# Patient Record
Sex: Female | Born: 1995 | Race: Asian | Hispanic: No | Marital: Single
Health system: Midwestern US, Community
[De-identification: ages and names within clinical notes are randomized; demographics above are authoritative.]

## PROBLEM LIST (undated history)

## (undated) DIAGNOSIS — N6320 Unspecified lump in the left breast, unspecified quadrant: Secondary | ICD-10-CM

---
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
## 2016-08-13 ENCOUNTER — Ambulatory Visit (HOSPITAL_COMMUNITY)
Admission: EM | Admit: 2016-08-13 | Discharge: 2016-08-13 | Disposition: A | Discharge status: Home / Self Care | Payer: BLUE CROSS/BLUE SHIELD | Attending: Family Medicine | Admitting: Family Medicine

## 2016-08-13 ENCOUNTER — Encounter (HOSPITAL_COMMUNITY): Payer: Self-pay | Admitting: Emergency Medicine

## 2016-08-13 DIAGNOSIS — N3090 Cystitis, unspecified without hematuria: Secondary | ICD-10-CM

## 2016-08-13 LAB — POCT URINALYSIS DIP (DEVICE)
BILIRUBIN URINE: NEGATIVE
Glucose, UA: NEGATIVE mg/dL
Ketones, ur: NEGATIVE mg/dL
NITRITE: NEGATIVE
PH: 8 (ref 5.0–8.0)
Protein, ur: NEGATIVE mg/dL
Specific Gravity, Urine: 1.02 (ref 1.005–1.030)
Urobilinogen, UA: 0.2 mg/dL (ref 0.0–1.0)

## 2016-08-13 LAB — POCT PREGNANCY, URINE: Preg Test, Ur: NEGATIVE

## 2016-08-13 MED ORDER — CEPHALEXIN 500 MG PO CAPS: 500.0000 mg | ORAL_CAPSULE | Freq: Four times a day (QID) | ORAL | 0 refills | Status: DC

## 2016-08-13 NOTE — ED Triage Notes (Signed)
Pt states when she urinates she is not emptying her bladder all the way.She denies any back or abdominal pain, or any itching.  She reports some pink in her urine today.

## 2016-08-13 NOTE — ED Provider Notes (Addendum)
[facility]    CSN: [id number] Arrival date & time: 08/13/16  1922     History   Chief Complaint Chief Complaint  Patient presents with  . Urinary Tract Infection    HPI Jennifer Strickland is a 21 y.o. female.   The history is provided by the patient.  Urinary Tract Infection  Pain quality:  Shooting and burning Pain severity:  Mild Onset quality:  Gradual Duration:  2 days Progression:  Worsening Chronicity:  New Recent urinary tract infections: no   Relieved by:  None tried Worsened by:  Nothing Ineffective treatments:  None tried Urinary symptoms: foul-smelling urine and hematuria   Associated symptoms: no abdominal pain, no fever, no flank pain, no nausea, no vaginal discharge and no vomiting   Risk factors: sexually active     History reviewed. No pertinent past medical history.  There are no active problems to display for this patient.   History reviewed. No pertinent surgical history.  OB History    No data available       Home Medications    Prior to Admission medications   Medication Sig Start Date End Date Taking? Authorizing Provider  acetaminophen (TYLENOL) 500 MG tablet Take 500 mg by mouth every 6 (six) hours as needed.    Historical Provider, MD  cephALEXin (KEFLEX) 500 MG capsule Take 1 capsule (500 mg total) by mouth 4 (four) times daily. Take all of medicine and drink lots of fluids 08/13/16   Linna HoffJames D Kindl, MD    Family History History reviewed. No pertinent family history.  Social History Social History  Substance Use Topics  . Smoking status: Never Smoker  . Smokeless tobacco: Never Used  . Alcohol use Yes     Comment: occasional     Allergies   Patient has no known allergies.   Review of Systems Review of Systems  Constitutional: Negative.  Negative for fever.  Respiratory: Negative.   Cardiovascular: Negative.   Gastrointestinal: Negative.  Negative for abdominal pain, nausea and vomiting.  Genitourinary:  Positive for dysuria, frequency and hematuria. Negative for flank pain, pelvic pain, urgency and vaginal discharge.  All other systems reviewed and are negative.    Physical Exam Triage Vital Signs ED Triage Vitals [08/13/16 2029]  Enc Vitals Group     BP 108/71     Pulse Rate 100     Resp      Temp 99.7 F (37.6 C)     Temp Source Oral     SpO2 100 %     Weight      Height      Head Circumference      Peak Flow      Pain Score      Pain Loc      Pain Edu?      Excl. in GC?    No data found.   Updated Vital Signs BP 108/71 (BP Location: Left Arm)   Pulse 100   Temp 99.7 F (37.6 C) (Oral)   LMP 08/12/2015 (Approximate)   SpO2 100%   Visual Acuity Right Eye Distance:   Left Eye Distance:   Bilateral Distance:    Right Eye Near:   Left Eye Near:    Bilateral Near:     Physical Exam  Constitutional: She appears well-developed and well-nourished. No distress.  Abdominal: Soft. Bowel sounds are normal. She exhibits no mass. There is no tenderness. There is no guarding.  Skin: Skin is warm and dry.  Nursing note and vitals reviewed.    UC Treatments / Results  Labs (all labs ordered are listed, but only abnormal results are displayed) Labs Reviewed  POCT URINALYSIS DIP (DEVICE) - Abnormal; Notable for the following:       Result Value   Hgb urine dipstick SMALL (*)    Leukocytes, UA MODERATE (*)    All other components within normal limits  POCT PREGNANCY, URINE    EKG  EKG Interpretation None       Radiology No results found.  Procedures Procedures (including critical care time)  Medications Ordered in UC Medications - No data to display   Initial Impression / Assessment and Plan / UC Course  I have reviewed the triage vital signs and the nursing notes.  Pertinent labs & imaging results that were available during my care of the patient were reviewed by me and considered in my medical decision making (see chart for details).        Final Clinical Impressions(s) / UC Diagnoses   Final diagnoses:  Cystitis    New Prescriptions New Prescriptions   CEPHALEXIN (KEFLEX) 500 MG CAPSULE    Take 1 capsule (500 mg total) by mouth 4 (four) times daily. Take all of medicine and drink lots of fluids     Linna Hoff, MD 08/13/16 4098    Linna Hoff, MD 08/13/16 2102

## 2018-07-20 DIAGNOSIS — Z01419 Encounter for gynecological examination (general) (routine) without abnormal findings: Secondary | ICD-10-CM | POA: Diagnosis not present

## 2018-07-20 DIAGNOSIS — Z23 Encounter for immunization: Secondary | ICD-10-CM | POA: Diagnosis not present

## 2018-07-20 DIAGNOSIS — Z113 Encounter for screening for infections with a predominantly sexual mode of transmission: Secondary | ICD-10-CM | POA: Diagnosis not present

## 2018-07-20 DIAGNOSIS — Z6824 Body mass index (BMI) 24.0-24.9, adult: Secondary | ICD-10-CM | POA: Diagnosis not present

## 2018-08-12 DIAGNOSIS — Z113 Encounter for screening for infections with a predominantly sexual mode of transmission: Secondary | ICD-10-CM | POA: Diagnosis not present

## 2018-08-12 DIAGNOSIS — A749 Chlamydial infection, unspecified: Secondary | ICD-10-CM | POA: Diagnosis not present

## 2018-09-21 DIAGNOSIS — Z23 Encounter for immunization: Secondary | ICD-10-CM | POA: Diagnosis not present

## 2018-12-14 DIAGNOSIS — H04123 Dry eye syndrome of bilateral lacrimal glands: Secondary | ICD-10-CM | POA: Diagnosis not present

## 2019-02-01 DIAGNOSIS — R87610 Atypical squamous cells of undetermined significance on cytologic smear of cervix (ASC-US): Secondary | ICD-10-CM | POA: Diagnosis not present

## 2019-04-05 DIAGNOSIS — K011 Impacted teeth: Secondary | ICD-10-CM | POA: Diagnosis not present

## 2019-04-26 DIAGNOSIS — N760 Acute vaginitis: Secondary | ICD-10-CM | POA: Diagnosis not present

## 2019-04-26 DIAGNOSIS — Z113 Encounter for screening for infections with a predominantly sexual mode of transmission: Secondary | ICD-10-CM | POA: Diagnosis not present

## 2019-07-26 DIAGNOSIS — Z1329 Encounter for screening for other suspected endocrine disorder: Secondary | ICD-10-CM | POA: Diagnosis not present

## 2019-07-26 DIAGNOSIS — Z6823 Body mass index (BMI) 23.0-23.9, adult: Secondary | ICD-10-CM | POA: Diagnosis not present

## 2019-07-26 DIAGNOSIS — Z113 Encounter for screening for infections with a predominantly sexual mode of transmission: Secondary | ICD-10-CM | POA: Diagnosis not present

## 2019-07-26 DIAGNOSIS — Z3040 Encounter for surveillance of contraceptives, unspecified: Secondary | ICD-10-CM | POA: Diagnosis not present

## 2019-07-26 DIAGNOSIS — Z13220 Encounter for screening for lipoid disorders: Secondary | ICD-10-CM | POA: Diagnosis not present

## 2019-07-26 DIAGNOSIS — Z01419 Encounter for gynecological examination (general) (routine) without abnormal findings: Secondary | ICD-10-CM | POA: Diagnosis not present

## 2019-07-26 DIAGNOSIS — Z13228 Encounter for screening for other metabolic disorders: Secondary | ICD-10-CM | POA: Diagnosis not present

## 2019-08-09 DIAGNOSIS — R87612 Low grade squamous intraepithelial lesion on cytologic smear of cervix (LGSIL): Secondary | ICD-10-CM | POA: Diagnosis not present

## 2019-08-09 DIAGNOSIS — N870 Mild cervical dysplasia: Secondary | ICD-10-CM | POA: Diagnosis not present

## 2019-08-09 DIAGNOSIS — R87810 Cervical high risk human papillomavirus (HPV) DNA test positive: Secondary | ICD-10-CM | POA: Diagnosis not present

## 2019-08-09 DIAGNOSIS — N72 Inflammatory disease of cervix uteri: Secondary | ICD-10-CM | POA: Diagnosis not present

## 2019-08-31 DIAGNOSIS — N871 Moderate cervical dysplasia: Secondary | ICD-10-CM | POA: Diagnosis not present

## 2019-08-31 DIAGNOSIS — N870 Mild cervical dysplasia: Secondary | ICD-10-CM | POA: Diagnosis not present

## 2019-11-15 ENCOUNTER — Other Ambulatory Visit: Payer: Self-pay

## 2019-11-15 ENCOUNTER — Encounter: Payer: Self-pay | Admitting: Podiatry

## 2019-11-15 ENCOUNTER — Ambulatory Visit: Payer: BC Managed Care – PPO

## 2019-11-15 ENCOUNTER — Ambulatory Visit (INDEPENDENT_AMBULATORY_CARE_PROVIDER_SITE_OTHER): Payer: BC Managed Care – PPO | Admitting: Podiatry

## 2019-11-15 ENCOUNTER — Other Ambulatory Visit: Payer: Self-pay | Admitting: Podiatry

## 2019-11-15 ENCOUNTER — Ambulatory Visit (INDEPENDENT_AMBULATORY_CARE_PROVIDER_SITE_OTHER): Payer: BC Managed Care – PPO

## 2019-11-15 VITALS — BP 92/52 | HR 79 | Temp 97.3°F | Resp 16

## 2019-11-15 DIAGNOSIS — M779 Enthesopathy, unspecified: Secondary | ICD-10-CM

## 2019-11-15 DIAGNOSIS — M79671 Pain in right foot: Secondary | ICD-10-CM

## 2019-11-15 DIAGNOSIS — M79672 Pain in left foot: Secondary | ICD-10-CM

## 2019-11-15 MED ORDER — MELOXICAM 15 MG PO TABS: 15.0000 mg | ORAL_TABLET | Freq: Every day | ORAL | 2 refills | Status: AC

## 2019-11-15 NOTE — Progress Notes (Signed)
DNOTE Subjective:   Patient ID: Jennifer Strickland, female   DOB: 24 y.o.   MRN: [id number]   HPI Patient presents stating that I get a lot of pain in my midfoot and heel both feet its been going on for around 3 years and the last years been bothering him more.  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  Patient does not smoke likes to be active   Review of Systems  All other systems reviewed and are negative.       Objective:  Physical Exam Vitals and nursing note reviewed.  Constitutional:      Appearance: She is well-developed.  Pulmonary:     Effort: Pulmonary effort is normal.  Musculoskeletal:        General: Normal range of motion.  Skin:    General: Skin is warm.  Neurological:     Mental Status: She is alert.     Neurovascular status intact muscle strength adequate range of motion within normal limits.  Patient is noted to have a lot of discomfort in the mid arch bilateral with inflammation also extending to the heel but no specific area of pain or positive Tinel's sign or other indications of nerve issues.  Mild depression of the arch but not within pathological range     Assessment:  Appears to be some form of a low-grade tendinitis fasciitis condition bilateral right over left     Plan:  H&P reviewed conditions and x-rays and today we went ahead and casted for functional orthotics to try to reduce stress on her feet.  Educated her on the type of running shoes I think would be best and stretching exercises and reappoint when orthotics ready and also placed on Mobic 15 mg daily  X-rays indicate that there is no signs of fracture or bone pathology

## 2019-11-15 NOTE — Patient Instructions (Signed)

## 2019-11-15 NOTE — Progress Notes (Signed)
   Subjective:    Patient ID: Jennifer Strickland, female    DOB: 26-Oct-1995, 24 y.o.   MRN: [id number]  HPI    Review of Systems  All other systems reviewed and are negative.      Objective:   Physical Exam        Assessment & Plan:

## 2019-12-22 ENCOUNTER — Ambulatory Visit: Payer: BC Managed Care – PPO | Admitting: Orthotics

## 2019-12-22 ENCOUNTER — Other Ambulatory Visit: Payer: Self-pay

## 2019-12-22 DIAGNOSIS — M79672 Pain in left foot: Secondary | ICD-10-CM

## 2019-12-22 DIAGNOSIS — M779 Enthesopathy, unspecified: Secondary | ICD-10-CM

## 2019-12-22 NOTE — Progress Notes (Signed)
Patient came in today to pick up custom made foot orthotics.  The goals were accomplished and the patient reported no dissatisfaction with said orthotics.  Patient was advised of breakin period and how to report any issues. 

## 2020-01-13 DIAGNOSIS — R21 Rash and other nonspecific skin eruption: Secondary | ICD-10-CM | POA: Diagnosis not present

## 2020-01-26 DIAGNOSIS — L282 Other prurigo: Secondary | ICD-10-CM | POA: Diagnosis not present

## 2020-01-26 DIAGNOSIS — R21 Rash and other nonspecific skin eruption: Secondary | ICD-10-CM | POA: Diagnosis not present

## 2020-02-14 DIAGNOSIS — H5213 Myopia, bilateral: Secondary | ICD-10-CM | POA: Diagnosis not present

## 2020-02-14 DIAGNOSIS — H53143 Visual discomfort, bilateral: Secondary | ICD-10-CM | POA: Diagnosis not present

## 2020-02-16 DIAGNOSIS — Z9889 Other specified postprocedural states: Secondary | ICD-10-CM | POA: Diagnosis not present

## 2020-02-16 DIAGNOSIS — R87613 High grade squamous intraepithelial lesion on cytologic smear of cervix (HGSIL): Secondary | ICD-10-CM | POA: Diagnosis not present

## 2020-02-21 DIAGNOSIS — S40862A Insect bite (nonvenomous) of left upper arm, initial encounter: Secondary | ICD-10-CM | POA: Diagnosis not present

## 2020-04-24 DIAGNOSIS — N760 Acute vaginitis: Secondary | ICD-10-CM | POA: Diagnosis not present

## 2020-04-24 DIAGNOSIS — Z113 Encounter for screening for infections with a predominantly sexual mode of transmission: Secondary | ICD-10-CM | POA: Diagnosis not present

## 2020-05-09 DIAGNOSIS — F4325 Adjustment disorder with mixed disturbance of emotions and conduct: Secondary | ICD-10-CM | POA: Diagnosis not present

## 2020-05-23 DIAGNOSIS — F4325 Adjustment disorder with mixed disturbance of emotions and conduct: Secondary | ICD-10-CM | POA: Diagnosis not present

## 2020-06-07 DIAGNOSIS — F4325 Adjustment disorder with mixed disturbance of emotions and conduct: Secondary | ICD-10-CM | POA: Diagnosis not present

## 2020-06-27 DIAGNOSIS — F4325 Adjustment disorder with mixed disturbance of emotions and conduct: Secondary | ICD-10-CM | POA: Diagnosis not present

## 2020-06-28 DIAGNOSIS — N900 Mild vulvar dysplasia: Secondary | ICD-10-CM | POA: Diagnosis not present

## 2020-06-30 DIAGNOSIS — Z01818 Encounter for other preprocedural examination: Secondary | ICD-10-CM | POA: Diagnosis not present

## 2020-06-30 DIAGNOSIS — L988 Other specified disorders of the skin and subcutaneous tissue: Secondary | ICD-10-CM | POA: Diagnosis not present

## 2020-06-30 DIAGNOSIS — L538 Other specified erythematous conditions: Secondary | ICD-10-CM | POA: Diagnosis not present

## 2020-07-25 DIAGNOSIS — F4325 Adjustment disorder with mixed disturbance of emotions and conduct: Secondary | ICD-10-CM | POA: Diagnosis not present

## 2020-08-08 DIAGNOSIS — F4325 Adjustment disorder with mixed disturbance of emotions and conduct: Secondary | ICD-10-CM | POA: Diagnosis not present

## 2020-08-15 DIAGNOSIS — F4325 Adjustment disorder with mixed disturbance of emotions and conduct: Secondary | ICD-10-CM | POA: Diagnosis not present

## 2020-08-29 DIAGNOSIS — F4325 Adjustment disorder with mixed disturbance of emotions and conduct: Secondary | ICD-10-CM | POA: Diagnosis not present

## 2020-09-12 DIAGNOSIS — F4325 Adjustment disorder with mixed disturbance of emotions and conduct: Secondary | ICD-10-CM | POA: Diagnosis not present

## 2020-09-26 DIAGNOSIS — F4325 Adjustment disorder with mixed disturbance of emotions and conduct: Secondary | ICD-10-CM | POA: Diagnosis not present

## 2020-10-02 DIAGNOSIS — Z01419 Encounter for gynecological examination (general) (routine) without abnormal findings: Secondary | ICD-10-CM | POA: Diagnosis not present

## 2020-10-02 DIAGNOSIS — Z6824 Body mass index (BMI) 24.0-24.9, adult: Secondary | ICD-10-CM | POA: Diagnosis not present

## 2020-10-10 DIAGNOSIS — F4325 Adjustment disorder with mixed disturbance of emotions and conduct: Secondary | ICD-10-CM | POA: Diagnosis not present

## 2020-10-24 DIAGNOSIS — F4325 Adjustment disorder with mixed disturbance of emotions and conduct: Secondary | ICD-10-CM | POA: Diagnosis not present

## 2020-11-07 DIAGNOSIS — F4325 Adjustment disorder with mixed disturbance of emotions and conduct: Secondary | ICD-10-CM | POA: Diagnosis not present

## 2020-11-21 DIAGNOSIS — F4325 Adjustment disorder with mixed disturbance of emotions and conduct: Secondary | ICD-10-CM | POA: Diagnosis not present

## 2020-12-19 DIAGNOSIS — F4325 Adjustment disorder with mixed disturbance of emotions and conduct: Secondary | ICD-10-CM | POA: Diagnosis not present

## 2021-01-02 DIAGNOSIS — F4325 Adjustment disorder with mixed disturbance of emotions and conduct: Secondary | ICD-10-CM | POA: Diagnosis not present

## 2021-01-10 DIAGNOSIS — F4325 Adjustment disorder with mixed disturbance of emotions and conduct: Secondary | ICD-10-CM | POA: Diagnosis not present

## 2021-01-30 DIAGNOSIS — F4325 Adjustment disorder with mixed disturbance of emotions and conduct: Secondary | ICD-10-CM | POA: Diagnosis not present

## 2021-02-27 DIAGNOSIS — F4325 Adjustment disorder with mixed disturbance of emotions and conduct: Secondary | ICD-10-CM | POA: Diagnosis not present

## 2021-03-15 DIAGNOSIS — F4325 Adjustment disorder with mixed disturbance of emotions and conduct: Secondary | ICD-10-CM | POA: Diagnosis not present

## 2021-03-27 DIAGNOSIS — F4325 Adjustment disorder with mixed disturbance of emotions and conduct: Secondary | ICD-10-CM | POA: Diagnosis not present

## 2021-03-29 DIAGNOSIS — F4325 Adjustment disorder with mixed disturbance of emotions and conduct: Secondary | ICD-10-CM | POA: Diagnosis not present

## 2021-04-12 DIAGNOSIS — F4325 Adjustment disorder with mixed disturbance of emotions and conduct: Secondary | ICD-10-CM | POA: Diagnosis not present

## 2021-04-23 DIAGNOSIS — R87610 Atypical squamous cells of undetermined significance on cytologic smear of cervix (ASC-US): Secondary | ICD-10-CM | POA: Diagnosis not present

## 2021-04-23 DIAGNOSIS — Z0142 Encounter for cervical smear to confirm findings of recent normal smear following initial abnormal smear: Secondary | ICD-10-CM | POA: Diagnosis not present

## 2021-04-24 DIAGNOSIS — F4325 Adjustment disorder with mixed disturbance of emotions and conduct: Secondary | ICD-10-CM | POA: Diagnosis not present

## 2021-05-08 DIAGNOSIS — F4325 Adjustment disorder with mixed disturbance of emotions and conduct: Secondary | ICD-10-CM | POA: Diagnosis not present

## 2021-05-24 DIAGNOSIS — F4325 Adjustment disorder with mixed disturbance of emotions and conduct: Secondary | ICD-10-CM | POA: Diagnosis not present

## 2021-06-05 DIAGNOSIS — F4325 Adjustment disorder with mixed disturbance of emotions and conduct: Secondary | ICD-10-CM | POA: Diagnosis not present

## 2021-06-19 DIAGNOSIS — F4325 Adjustment disorder with mixed disturbance of emotions and conduct: Secondary | ICD-10-CM | POA: Diagnosis not present

## 2021-07-03 DIAGNOSIS — F4325 Adjustment disorder with mixed disturbance of emotions and conduct: Secondary | ICD-10-CM | POA: Diagnosis not present

## 2021-07-17 DIAGNOSIS — F4325 Adjustment disorder with mixed disturbance of emotions and conduct: Secondary | ICD-10-CM | POA: Diagnosis not present

## 2021-07-31 DIAGNOSIS — F4325 Adjustment disorder with mixed disturbance of emotions and conduct: Secondary | ICD-10-CM | POA: Diagnosis not present

## 2021-08-14 DIAGNOSIS — F4325 Adjustment disorder with mixed disturbance of emotions and conduct: Secondary | ICD-10-CM | POA: Diagnosis not present

## 2021-08-28 DIAGNOSIS — F4325 Adjustment disorder with mixed disturbance of emotions and conduct: Secondary | ICD-10-CM | POA: Diagnosis not present

## 2021-09-11 DIAGNOSIS — F4325 Adjustment disorder with mixed disturbance of emotions and conduct: Secondary | ICD-10-CM | POA: Diagnosis not present

## 2021-09-25 DIAGNOSIS — F4325 Adjustment disorder with mixed disturbance of emotions and conduct: Secondary | ICD-10-CM | POA: Diagnosis not present

## 2021-10-08 DIAGNOSIS — Z01419 Encounter for gynecological examination (general) (routine) without abnormal findings: Secondary | ICD-10-CM | POA: Diagnosis not present

## 2021-10-08 DIAGNOSIS — Z113 Encounter for screening for infections with a predominantly sexual mode of transmission: Secondary | ICD-10-CM | POA: Diagnosis not present

## 2021-10-08 DIAGNOSIS — Z309 Encounter for contraceptive management, unspecified: Secondary | ICD-10-CM | POA: Diagnosis not present

## 2021-10-08 DIAGNOSIS — Z124 Encounter for screening for malignant neoplasm of cervix: Secondary | ICD-10-CM | POA: Diagnosis not present

## 2021-10-08 DIAGNOSIS — R319 Hematuria, unspecified: Secondary | ICD-10-CM | POA: Diagnosis not present

## 2021-10-08 DIAGNOSIS — Z6825 Body mass index (BMI) 25.0-25.9, adult: Secondary | ICD-10-CM | POA: Diagnosis not present

## 2021-10-16 DIAGNOSIS — F4325 Adjustment disorder with mixed disturbance of emotions and conduct: Secondary | ICD-10-CM | POA: Diagnosis not present

## 2021-10-30 DIAGNOSIS — F4325 Adjustment disorder with mixed disturbance of emotions and conduct: Secondary | ICD-10-CM | POA: Diagnosis not present

## 2021-11-05 ENCOUNTER — Ambulatory Visit (INDEPENDENT_AMBULATORY_CARE_PROVIDER_SITE_OTHER): Payer: BC Managed Care – PPO | Admitting: Podiatry

## 2021-11-05 ENCOUNTER — Ambulatory Visit (INDEPENDENT_AMBULATORY_CARE_PROVIDER_SITE_OTHER): Payer: BC Managed Care – PPO

## 2021-11-05 ENCOUNTER — Encounter: Payer: Self-pay | Admitting: Podiatry

## 2021-11-05 DIAGNOSIS — M779 Enthesopathy, unspecified: Secondary | ICD-10-CM

## 2021-11-05 DIAGNOSIS — M79671 Pain in right foot: Secondary | ICD-10-CM

## 2021-11-05 NOTE — Progress Notes (Signed)
SITUATION ?Reason for Consult: Evaluation for Bilateral Custom Foot Orthoses ?Patient / Caregiver Report: Patient is ready for foot orthotics ? ?OBJECTIVE DATA: ?Patient History / Diagnosis:  ?  ICD-10-CM   ?1. Tendonitis  M77.9   ?  ?2. Pain in both feet  M79.671   ? R97.588   ?  ? ? ?Current or Previous Devices:   Current user ? ?Foot Examination: ?Skin presentation:   Intact ?Ulcers & Callousing:   None ?Toe / Foot Deformities:  Pes cavus ?Weight Bearing Presentation:  Cavus ?Sensation:    Intact ? ?Shoe Size:    7.54M ? ?ORTHOTIC RECOMMENDATION ?Recommended Device: 1x pair of custom functional foot orthotics ? ?GOALS OF ORTHOSES ?- Reduce Pain ?- Prevent Foot Deformity ?- Prevent Progression of Further Foot Deformity ?- Relieve Pressure ?- Improve the Overall Biomechanical Function of the Foot and Lower Extremity. ? ?ACTIONS PERFORMED ?Potential out of pocket cost was communicated to patient. Patient understood and consent to casting. Patient was casted for Foot Orthoses via crush box. Procedure was explained and patient tolerated procedure well. Casts were shipped to central fabrication. All questions were answered and concerns addressed. ? ?PLAN ?Patient is to be called for fitting when devices are ready.  ? ? ?

## 2021-11-05 NOTE — Progress Notes (Signed)
Subjective:  ? ?Patient ID: Jennifer Strickland, female   DOB: 26 y.o.   MRN: [id number]  ? ?HPI ?Patient states that she has had chronic pain in both feet and that she needs new orthotics which were beneficial for her several years ago ? ? ?ROS ? ? ?   ?Objective:  ?Physical Exam  ?Ocular status intact with patient found to have low-grade discomfort both feet with patient having weightbearing job and needs support due to moderate flatness of the arch ? ?   ?Assessment:  ?Chronic structural issues with demands on her feet creating these symptoms with orthotics which were beneficial but have started to flatten due to longstanding usage ? ?   ?Plan:  ?H&P reviewed condition orthotics scanned today by pedorthist and will be done by him with consideration for redoing the existing pair and patient will be seen back and if symptoms were to get worse will be seen by me ?   ? ? ?

## 2021-11-13 DIAGNOSIS — F4325 Adjustment disorder with mixed disturbance of emotions and conduct: Secondary | ICD-10-CM | POA: Diagnosis not present

## 2021-11-26 ENCOUNTER — Ambulatory Visit: Payer: BC Managed Care – PPO

## 2021-11-26 DIAGNOSIS — M779 Enthesopathy, unspecified: Secondary | ICD-10-CM

## 2021-11-26 NOTE — Progress Notes (Signed)
SITUATION: ?Reason for Visit: Fitting and Delivery of Custom Fabricated Foot Orthoses ?Patient Report: Patient reports comfort and is satisfied with device. ? ?OBJECTIVE DATA: ?Patient History / Diagnosis:   ?  ICD-10-CM   ?1. Tendonitis  M77.9   ?  ? ? ?Provided Device:  Custom Functional Foot Orthotics ?    [facility]: [id number] ? ?GOAL OF ORTHOSIS ?- Improve gait ?- Decrease energy expenditure ?- Improve Balance ?- Provide Triplanar stability of foot complex ?- Facilitate motion ? ?ACTIONS PERFORMED ?Patient was fit with foot orthotics trimmed to shoe last. Patient tolerated fittign procedure.  ? ?Patient was provided with verbal and written instruction and demonstration regarding donning, doffing, wear, care, proper fit, function, purpose, cleaning, and use of the orthosis and in all related precautions and risks and benefits regarding the orthosis. ? ?Patient was also provided with verbal instruction regarding how to report any failures or malfunctions of the orthosis and necessary follow up care. Patient was also instructed to contact our office regarding any change in status that may affect the function of the orthosis. ? ?Patient demonstrated independence with proper donning, doffing, and fit and verbalized understanding of all instructions. ? ?PLAN: ?Patient is to follow up in one week or as necessary (PRN). All questions were answered and concerns addressed. Plan of care was discussed with and agreed upon by the patient. ? ?

## 2021-11-26 NOTE — Progress Notes (Signed)
Formatting of this note is different from the original.  SITUATION:  Reason for Visit: Fitting and Delivery of Custom Fabricated Foot Orthoses  Patient Report: Patient reports comfort and is satisfied with device.    OBJECTIVE DATA:  Patient History / Diagnosis:      ICD-10-CM    1. Tendonitis  M77.9        Provided Device:  Custom Functional Foot Orthotics      [facility]: [id number]    GOAL OF ORTHOSIS  - Improve gait  - Decrease energy expenditure  - Improve Balance  - Provide Triplanar stability of foot complex  - Facilitate motion    ACTIONS PERFORMED  Patient was fit with foot orthotics trimmed to shoe last. Patient tolerated fittign procedure.     Patient was provided with verbal and written instruction and demonstration regarding donning, doffing, wear, care, proper fit, function, purpose, cleaning, and use of the orthosis and in all related precautions and risks and benefits regarding the orthosis.    Patient was also provided with verbal instruction regarding how to report any failures or malfunctions of the orthosis and necessary follow up care. Patient was also instructed to contact our office regarding any change in status that may affect the function of the orthosis.    Patient demonstrated independence with proper donning, doffing, and fit and verbalized understanding of all instructions.    PLAN:  Patient is to follow up in one week or as necessary (PRN). All questions were answered and concerns addressed. Plan of care was discussed with and agreed upon by the patient.    Electronically signed by Gates Rigg T at 11/26/2021 11:33 AM EDT

## 2021-12-18 DIAGNOSIS — F4325 Adjustment disorder with mixed disturbance of emotions and conduct: Secondary | ICD-10-CM | POA: Diagnosis not present

## 2022-10-21 ENCOUNTER — Encounter

## 2022-10-21 ENCOUNTER — Other Ambulatory Visit: Payer: Self-pay | Admitting: Obstetrics and Gynecology

## 2022-10-21 DIAGNOSIS — N6320 Unspecified lump in the left breast, unspecified quadrant: Secondary | ICD-10-CM

## 2022-10-22 ENCOUNTER — Encounter

## 2022-10-22 ENCOUNTER — Inpatient Hospital Stay: Admit: 2022-10-22 | Payer: BLUE CROSS/BLUE SHIELD | Attending: Adult Health

## 2022-10-22 DIAGNOSIS — N6321 Unspecified lump in the left breast, upper outer quadrant: Secondary | ICD-10-CM

## 2022-10-22 DIAGNOSIS — N6320 Unspecified lump in the left breast, unspecified quadrant: Secondary | ICD-10-CM
